# Patient Record
Sex: Male | Born: 1960 | Hispanic: No | Marital: Single | Smoking: Current some day smoker
Health system: Southern US, Community
[De-identification: ages and names within clinical notes are randomized; demographics above are authoritative.]

## PROBLEM LIST (undated history)

## (undated) DIAGNOSIS — M629 Disorder of muscle, unspecified: Secondary | ICD-10-CM

## (undated) DIAGNOSIS — N289 Disorder of kidney and ureter, unspecified: Secondary | ICD-10-CM

## (undated) HISTORY — DX: Disorder of kidney and ureter, unspecified: N28.9

## (undated) HISTORY — DX: Disorder of muscle, unspecified: M62.9

---

## 2000-06-22 ENCOUNTER — Emergency Department (HOSPITAL_COMMUNITY): Admission: EM | Admit: 2000-06-22 | Discharge: 2000-06-22 | Payer: Self-pay | Admitting: Emergency Medicine

## 2000-06-22 ENCOUNTER — Encounter: Payer: Self-pay | Admitting: Emergency Medicine

## 2012-09-08 ENCOUNTER — Ambulatory Visit (INDEPENDENT_AMBULATORY_CARE_PROVIDER_SITE_OTHER): Payer: Self-pay | Admitting: Emergency Medicine

## 2012-09-08 VITALS — BP 142/92 | HR 84 | Temp 98.0°F | Resp 16 | Ht 66.0 in | Wt 211.0 lb

## 2012-09-08 DIAGNOSIS — R109 Unspecified abdominal pain: Secondary | ICD-10-CM

## 2012-09-08 DIAGNOSIS — N201 Calculus of ureter: Secondary | ICD-10-CM

## 2012-09-08 LAB — POCT CBC
Granulocyte percent: 68.8 %G (ref 37–80)
HCT, POC: 46.6 % (ref 43.5–53.7)
Hemoglobin: 14.9 g/dL (ref 14.1–18.1)
Lymph, poc: 2.7 (ref 0.6–3.4)
MCH, POC: 26.6 pg — AB (ref 27–31.2)
MCHC: 32 g/dL (ref 31.8–35.4)
MCV: 83.2 fL (ref 80–97)
MID (cbc): 0.9 (ref 0–0.9)
MPV: 8.9 fL (ref 0–99.8)
POC Granulocyte: 7.9 — AB (ref 2–6.9)
POC LYMPH PERCENT: 23.8 %L (ref 10–50)
POC MID %: 7.4 %M (ref 0–12)
Platelet Count, POC: 329 10*3/uL (ref 142–424)
RBC: 5.6 M/uL (ref 4.69–6.13)
RDW, POC: 14.5 %
WBC: 11.5 10*3/uL — AB (ref 4.6–10.2)

## 2012-09-08 LAB — POCT URINALYSIS DIPSTICK
Bilirubin, UA: NEGATIVE
Glucose, UA: NEGATIVE
Ketones, UA: NEGATIVE
Nitrite, UA: NEGATIVE
Protein, UA: 100
Spec Grav, UA: >=1.03
Urobilinogen, UA: 0.2
pH, UA: 6

## 2012-09-08 LAB — POCT UA - MICROSCOPIC ONLY
Bacteria, U Microscopic: NEGATIVE
Casts, Ur, LPF, POC: NEGATIVE
Crystals, Ur, HPF, POC: NEGATIVE
Mucus, UA: POSITIVE
Yeast, UA: NEGATIVE

## 2012-09-08 MED ORDER — OMEPRAZOLE 40 MG PO CPDR: 40.0000 mg | DELAYED_RELEASE_CAPSULE | Freq: Every day | ORAL | Status: AC

## 2012-09-08 MED ORDER — HYDROCODONE-ACETAMINOPHEN 5-325 MG PO TABS
1.0000 | ORAL_TABLET | ORAL | Status: AC | PRN
Start: 2012-09-08 — End: 2012-09-18

## 2012-09-08 NOTE — Progress Notes (Signed)
[facility] [address], [place] [address] [phone]  Date:  09/08/2012   Name:  Donald Holmes   DOB:  February 13, 1961   MRN:  [id number]  PCP:  No primary provider on file.    Chief Complaint: Abdominal Pain and Hematuria   History of Present Illness:  Donald Holmes is a 51 y.o. very pleasant male patient who presents with the following:  three day history of upper abdominal discomfort/epigastric pain.  Says that it comes and goes and comes back suddenly associated with some palpitations.  Nauseated but not able to vomit.  No fever or chills, no food intolerance.  No heartburn or waterbrash.  No shortness of breath or wheezing.  Pain worse with resting.  Noting related to relief.  Pain rather intense currently.  Smokes.  No alcohol use.  No use excessive NSAID.  No DM, HBP, cholesterol issues.  FH stroke, HBP, CAD.    Patient has history of kidney stones and is currently having hematuria and pain into right groin.  Passed two stones last week  1600:   Patient says abdominal pain is gone  There is no problem list on file for this patient.   Past Medical History  Diagnosis Date  . Kidney disease   . Muscle disorder     No past surgical history on file.  History  Substance Use Topics  . Smoking status: Current Every Day Smoker  . Smokeless tobacco: Not on file  . Alcohol Use: Not on file    No family history on file.  No Known Allergies  Medication list has been reviewed and updated.  No current outpatient prescriptions on file prior to visit.    Review of Systems:  As per HPI, otherwise negative.    Physical Examination: Filed Vitals:   09/08/12 1431  BP: 142/92  Pulse: 84  Temp: 98 F (36.7 C)  Resp: 16   Filed Vitals:   09/08/12 1431  Height: 5\' 6"  (1.676 m)  Weight: 211 lb (95.709 kg)   Body mass index is 34.06 kg/(m^2). Ideal Body Weight: Weight in (lb) to have BMI = 25: 154.6   GEN: WDWN, moderate distress,  Non-toxic, A & O x 3  No sepsis or rash or shortness of breath.  Not icteric. HEENT: Atraumatic, Normocephalic. Neck supple. No masses, No LAD. Oropharynx negative  Ears and Nose: No external deformity.  TM negative CV: RRR, No M/G/R. No JVD. No thrill. No extra heart sounds. PULM: CTA B, no wheezes, crackles, rhonchi. No retractions. No resp. distress. No accessory muscle use. ABD: S, NT, ND, +BS. No rebound. No HSM. EXTR: No c/c/e NEURO Normal gait.  PSYCH: Normally interactive. Conversant. Not depressed or anxious appearing.  Calm demeanor.    Assessment and Plan: Abdominal pain Ureterolithiasis Hydrocodone Urology consult  Carmelina Dane, MD  Results for orders placed in visit on 09/08/12  POCT CBC      Component Value Range   WBC 11.5 (*) 4.6 - 10.2 K/uL   Lymph, poc 2.7  0.6 - 3.4   POC LYMPH PERCENT 23.8  10 - 50 %L   MID (cbc) 0.9  0 - 0.9   POC MID % 7.4  0 - 12 %M   POC Granulocyte 7.9 (*) 2 - 6.9   Granulocyte percent 68.8  37 - 80 %G   RBC 5.60  4.69 - 6.13 M/uL   Hemoglobin 14.9  14.1 - 18.1 g/dL   HCT, POC 46.6  43.5 - 53.7 %   MCV 83.2  80 - 97 fL   MCH, POC 26.6 (*) 27 - 31.2 pg   MCHC 32.0  31.8 - 35.4 g/dL   RDW, POC 96.0     Platelet Count, POC 329  142 - 424 K/uL   MPV 8.9  0 - 99.8 fL  POCT UA - MICROSCOPIC ONLY      Component Value Range   WBC, Ur, HPF, POC 2-11     RBC, urine, microscopic 14-20     Bacteria, U Microscopic neg     Mucus, UA positive     Epithelial cells, urine per micros 2-5     Crystals, Ur, HPF, POC neg     Casts, Ur, LPF, POC neg     Yeast, UA neg    POCT URINALYSIS DIPSTICK      Component Value Range   Color, UA yellow     Clarity, UA cloudy     Glucose, UA neg     Bilirubin, UA neg     Ketones, UA neg     Spec Grav, UA >=1.030     Blood, UA large     pH, UA 6.0     Protein, UA 100     Urobilinogen, UA 0.2     Nitrite, UA neg     Leukocytes, UA small (1+)

## 2012-09-09 LAB — COMPREHENSIVE METABOLIC PANEL
ALT: 15 U/L (ref 0–53)
AST: 12 U/L (ref 0–37)
Albumin: 4.3 g/dL (ref 3.5–5.2)
Alkaline Phosphatase: 97 U/L (ref 39–117)
BUN: 8 mg/dL (ref 6–23)
CO2: 25 mEq/L (ref 19–32)
Calcium: 7.8 mg/dL — ABNORMAL LOW (ref 8.4–10.5)
Chloride: 105 mEq/L (ref 96–112)
Creat: 0.78 mg/dL (ref 0.50–1.35)
Glucose, Bld: 104 mg/dL — ABNORMAL HIGH (ref 70–99)
Potassium: 4.9 mEq/L (ref 3.5–5.3)
Sodium: 141 mEq/L (ref 135–145)
Total Bilirubin: 0.6 mg/dL (ref 0.3–1.2)
Total Protein: 6.9 g/dL (ref 6.0–8.3)

## 2012-09-09 LAB — H. PYLORI ANTIBODY, IGG: H Pylori IgG: 5.11 {ISR} — ABNORMAL HIGH

## 2012-09-11 MED ORDER — TINIDAZOLE 500 MG PO TABS: 500.0000 mg | ORAL_TABLET | Freq: Two times a day (BID) | ORAL | Status: DC

## 2012-09-11 MED ORDER — CLARITHROMYCIN ER 500 MG PO TB24: 500.0000 mg | ORAL_TABLET | Freq: Two times a day (BID) | ORAL | Status: DC

## 2012-09-11 MED ORDER — AMOXICILLIN 500 MG PO CAPS: 1000.0000 mg | ORAL_CAPSULE | Freq: Two times a day (BID) | ORAL | Status: DC

## 2012-09-11 MED ORDER — RABEPRAZOLE SODIUM 20 MG PO TBEC: 20.0000 mg | DELAYED_RELEASE_TABLET | Freq: Every day | ORAL | Status: DC

## 2012-09-11 NOTE — Addendum Note (Signed)
Addended by: Carmelina Dane on: 09/11/2012 01:37 PM   Modules accepted: Orders

## 2012-09-11 NOTE — Progress Notes (Signed)
  Subjective:    Patient ID: Donald Holmes, male    DOB: October 14, 1961, 51 y.o.   MRN: [id number]  HPI    Review of Systems     Objective:   Physical Exam        Assessment & Plan:    Patient has H.pylori positive.  Will treat using sequential therapy described in [place]

## 2014-05-20 ENCOUNTER — Emergency Department (HOSPITAL_COMMUNITY): Payer: Self-pay

## 2014-05-20 ENCOUNTER — Emergency Department (HOSPITAL_COMMUNITY)
Admission: EM | Admit: 2014-05-20 | Discharge: 2014-05-20 | Disposition: A | Discharge status: Home / Self Care | Payer: Self-pay | Attending: Emergency Medicine | Admitting: Emergency Medicine

## 2014-05-20 ENCOUNTER — Encounter (HOSPITAL_COMMUNITY): Payer: Self-pay | Admitting: Emergency Medicine

## 2014-05-20 DIAGNOSIS — S199XXA Unspecified injury of neck, initial encounter: Secondary | ICD-10-CM

## 2014-05-20 DIAGNOSIS — S46909A Unspecified injury of unspecified muscle, fascia and tendon at shoulder and upper arm level, unspecified arm, initial encounter: Secondary | ICD-10-CM | POA: Insufficient documentation

## 2014-05-20 DIAGNOSIS — IMO0002 Reserved for concepts with insufficient information to code with codable children: Secondary | ICD-10-CM | POA: Insufficient documentation

## 2014-05-20 DIAGNOSIS — W19XXXA Unspecified fall, initial encounter: Secondary | ICD-10-CM

## 2014-05-20 DIAGNOSIS — S79929A Unspecified injury of unspecified thigh, initial encounter: Secondary | ICD-10-CM

## 2014-05-20 DIAGNOSIS — S4980XA Other specified injuries of shoulder and upper arm, unspecified arm, initial encounter: Secondary | ICD-10-CM | POA: Insufficient documentation

## 2014-05-20 DIAGNOSIS — Z79899 Other long term (current) drug therapy: Secondary | ICD-10-CM | POA: Insufficient documentation

## 2014-05-20 DIAGNOSIS — Y929 Unspecified place or not applicable: Secondary | ICD-10-CM | POA: Insufficient documentation

## 2014-05-20 DIAGNOSIS — Z87448 Personal history of other diseases of urinary system: Secondary | ICD-10-CM | POA: Insufficient documentation

## 2014-05-20 DIAGNOSIS — W010XXA Fall on same level from slipping, tripping and stumbling without subsequent striking against object, initial encounter: Secondary | ICD-10-CM | POA: Insufficient documentation

## 2014-05-20 DIAGNOSIS — F17200 Nicotine dependence, unspecified, uncomplicated: Secondary | ICD-10-CM | POA: Insufficient documentation

## 2014-05-20 DIAGNOSIS — Y9389 Activity, other specified: Secondary | ICD-10-CM | POA: Insufficient documentation

## 2014-05-20 DIAGNOSIS — S0993XA Unspecified injury of face, initial encounter: Secondary | ICD-10-CM | POA: Insufficient documentation

## 2014-05-20 DIAGNOSIS — S79919A Unspecified injury of unspecified hip, initial encounter: Secondary | ICD-10-CM | POA: Insufficient documentation

## 2014-05-20 DIAGNOSIS — S6990XA Unspecified injury of unspecified wrist, hand and finger(s), initial encounter: Secondary | ICD-10-CM | POA: Insufficient documentation

## 2014-05-20 MED ORDER — IBUPROFEN 600 MG PO TABS: 600.0000 mg | ORAL_TABLET | Freq: Four times a day (QID) | ORAL | Status: DC | PRN

## 2014-05-20 MED ORDER — KETOROLAC TROMETHAMINE 60 MG/2ML IM SOLN
60.0000 mg | Freq: Once | INTRAMUSCULAR | Status: AC
  Administered 2014-05-20: 60 mg via INTRAMUSCULAR
  Filled 2014-05-20: qty 2

## 2014-05-20 MED ORDER — HYDROCODONE-ACETAMINOPHEN 5-325 MG PO TABS: 1.0000 | ORAL_TABLET | ORAL | Status: DC | PRN

## 2014-05-20 NOTE — Discharge Instructions (Signed)
Your x-rays were all normal. Take ibuprofen as prescribed for pain. Norco for severe pain as prescribed as needed. Ice your hand and shoulder several times a day. Followup with primary care Dr. for recheck.

## 2014-05-20 NOTE — ED Notes (Signed)
Pt states he slipped and fell today on tile floor @ 1430-1500 today. Pt c/o pain to R th finger, R groin, R sided stiff neck, R back stiffness.

## 2014-05-20 NOTE — ED Provider Notes (Signed)
Medical screening examination/treatment/procedure(s) were performed by non-physician practitioner and as supervising physician I was immediately available for consultation/collaboration.   EKG Interpretation None       Derwood KaplanAnkit Lorelai Huyser, MD 05/20/14 2305

## 2014-05-20 NOTE — ED Provider Notes (Signed)
CSN: [id number]     Arrival date & time 05/20/14  0035 History   First MD Initiated Contact with Patient 05/20/14 [phone]     Chief Complaint  Patient presents with  . Fall     (Consider location/radiation/quality/duration/timing/severity/associated sxs/prior Treatment) HPI Donald Holmes is a 53 y.o. male who presents to emergency department complaining of a fall. Patient states he was at a grocery store and slipped on some water, states he fell down. He did not hit his head or have loss of consciousness. He states he fell onto the right side. He reports pain in the right shoulder, right hand, right hip. He states his neck is tight enough. He took a Microbiologistercocet and ibuprofen before coming in with no relief of his symptoms. States the pain is sharp, worsened with movement, nothing makes it better. No numbness or weakness in extremities. No loss of bowels or urinary incontinence or retention. No other complaints.  Past Medical History  Diagnosis Date  . Kidney disease   . Muscle disorder    History reviewed. No pertinent past surgical history. No family history on file. History  Substance Use Topics  . Smoking status: Current Some Day Smoker  . Smokeless tobacco: Not on file  . Alcohol Use: Yes     Comment: rare    Review of Systems  Constitutional: Negative for fever and chills.  Respiratory: Negative for cough, chest tightness and shortness of breath.   Cardiovascular: Negative for chest pain, palpitations and leg swelling.  Gastrointestinal: Negative for nausea, vomiting, abdominal pain, diarrhea and abdominal distention.  Genitourinary: Negative for dysuria, urgency, frequency and hematuria.  Musculoskeletal: Positive for arthralgias, back pain, joint swelling, myalgias, neck pain and neck stiffness.  Skin: Negative for rash.  Allergic/Immunologic: Negative for immunocompromised state.  Neurological: Negative for dizziness, weakness, light-headedness, numbness and headaches.       Allergies  Review of patient's allergies indicates no known allergies.  Home Medications   Prior to Admission medications   Medication Sig Start Date End Date Taking? Authorizing Provider  ibuprofen (ADVIL,MOTRIN) 200 MG tablet Take 200 mg by mouth every 6 (six) hours as needed for moderate pain.   Yes Historical Provider, MD  omeprazole (PRILOSEC) 40 MG capsule Take 1 capsule (40 mg total) by mouth daily. 09/08/12  Yes Phillips OdorJeffery Anderson, MD  oxyCODONE-acetaminophen (PERCOCET/ROXICET) 5-325 MG per tablet Take 1 tablet by mouth as needed.   Yes Historical Provider, MD   BP 170/99  Pulse 103  Temp(Src) 98.7 F (37.1 C) (Oral)  Resp 18  Ht 5\' 6"  (1.676 m)  Wt 215 lb (97.523 kg)  BMI 34.72 kg/m2  SpO2 98% Physical Exam  Nursing note and vitals reviewed. Constitutional: He is oriented to person, place, and time. He appears well-developed and well-nourished. No distress.  HENT:  Head: Normocephalic and atraumatic.  Eyes: Conjunctivae are normal.  Neck: Neck supple.  Cardiovascular: Normal rate, regular rhythm and normal heart sounds.   Pulmonary/Chest: Effort normal. No respiratory distress. He has no wheezes. He has no rales.  Abdominal: Soft. Bowel sounds are normal. He exhibits no distension. There is no tenderness. There is no rebound.  Musculoskeletal:  No midline cervical or thoracic spine tenderness. Bilateral perivertebral cervical spine tenderness. Pain over anterior right shoulder. Pain with ROM of the right shoulder. Limitted rom with flexion due to pain. Tender to palpation over right 5th and 4th metacarpals. Pain with ROM of the 4th and 5th fingers at MCP joints. Right hip tender to palpation over  greater trochanter. Pain with ROM. Full ROM. Right knee and ankle normal. Dorsal pedal pulses intact.   Neurological: He is alert and oriented to person, place, and time.  Gait is normal  Skin: Skin is warm and dry.  Psychiatric: He has a normal mood and affect. His  behavior is normal.    ED Course  Procedures (including critical care time) Labs Review Labs Reviewed - No data to display  Imaging Review Dg Lumbar Spine Complete  05/20/2014   CLINICAL DATA:  Fall.  Back stiffness  EXAM: LUMBAR SPINE - COMPLETE 4+ VIEW  COMPARISON:  None.  FINDINGS: No evidence of acute fracture or traumatic malalignment.  There are bulky osteophytes from the lower thoracic and lumbar endplates which could be degenerative or from spondyloarthropathy. There is focal degenerative disc narrowing at L5-S1. Large bilateral lamellated renal calculi, measuring up to 3 cm.  IMPRESSION: 1. No acute osseous findings. 2. Large bilateral renal calculi. Recommend outpatient urology referral.   Electronically Signed   By: Tiburcio PeaJonathan  Watts M.D.   On: 05/20/2014 02:13   Dg Shoulder Right  05/20/2014   CLINICAL DATA:  Fall.  Right shoulder pain.  EXAM: RIGHT SHOULDER - 2+ VIEW  COMPARISON:  None.  FINDINGS: Degenerative changes in the right AC and glenohumeral joints. No acute bony abnormality. Specifically, no fracture, subluxation, or dislocation. Soft tissues are intact.  IMPRESSION: No acute bony abnormality.   Electronically Signed   By: Charlett NoseKevin  Dover M.D.   On: 05/20/2014 02:11   Dg Hip Complete Right  05/20/2014   CLINICAL DATA:  Fall.  Right hip pain.  EXAM: RIGHT HIP - COMPLETE 2+ VIEW  COMPARISON:  None.  FINDINGS: Slight joint space narrowing and spurring in the hip joints bilaterally. SI joints are symmetric and unremarkable. No acute bony abnormality. Specifically, no fracture, subluxation, or dislocation. Soft tissues are intact.  IMPRESSION: No acute bony abnormality.   Electronically Signed   By: Charlett NoseKevin  Dover M.D.   On: 05/20/2014 02:11   Dg Hand Complete Right  05/20/2014   CLINICAL DATA:  Fall.  Pain.  EXAM: RIGHT HAND - COMPLETE 3+ VIEW  COMPARISON:  None.  FINDINGS: Moderate degenerative changes in the IP joints and first carpometacarpal joint. Mild joint space narrowing and  spurring in the MCP joints. No fracture, subluxation or dislocation.  IMPRESSION: No acute bony abnormality.   Electronically Signed   By: Charlett NoseKevin  Dover M.D.   On: 05/20/2014 02:12     EKG Interpretation None      MDM   Final diagnoses:  Fall, initial encounter    Patient is here after a fall at a grocery store. He is ambulatory. Pain to the right hand, right shoulder, right hip, lumbar spine. X-rays obtained, negative. Patient drove to emergency department, treated with Toradol in the ED. Home with Norco, ibuprofen, followup with primary care Dr. Montez MoritaX-rays the lumbar spine that showed kidney stones, patient is aware. Will follow with urology.   Filed Vitals:   05/20/14 0055 05/20/14 0316  BP: 170/99 158/90  Pulse: 103 98  Temp: 98.7 F (37.1 C)   TempSrc: Oral   Resp: 18   Height: 5\' 6"  (1.676 m)   Weight: 215 lb (97.523 kg)   SpO2: 98% 98%       Quashawn Jewkes A Quinnetta Roepke, PA-C 05/20/14 0522

## 2015-02-10 ENCOUNTER — Ambulatory Visit (INDEPENDENT_AMBULATORY_CARE_PROVIDER_SITE_OTHER): Payer: Self-pay

## 2015-02-10 ENCOUNTER — Ambulatory Visit (INDEPENDENT_AMBULATORY_CARE_PROVIDER_SITE_OTHER): Payer: Self-pay | Admitting: Family Medicine

## 2015-02-10 VITALS — BP 150/100 | HR 91 | Temp 98.1°F | Resp 16 | Ht 66.0 in | Wt 210.0 lb

## 2015-02-10 DIAGNOSIS — N139 Obstructive and reflux uropathy, unspecified: Secondary | ICD-10-CM

## 2015-02-10 DIAGNOSIS — N138 Other obstructive and reflux uropathy: Secondary | ICD-10-CM

## 2015-02-10 DIAGNOSIS — N200 Calculus of kidney: Secondary | ICD-10-CM

## 2015-02-10 DIAGNOSIS — N4889 Other specified disorders of penis: Secondary | ICD-10-CM

## 2015-02-10 DIAGNOSIS — R1032 Left lower quadrant pain: Secondary | ICD-10-CM

## 2015-02-10 LAB — POCT UA - MICROSCOPIC ONLY
Bacteria, U Microscopic: NEGATIVE
CASTS, UR, LPF, POC: NEGATIVE
Crystals, Ur, HPF, POC: NEGATIVE
Mucus, UA: POSITIVE
Yeast, UA: NEGATIVE

## 2015-02-10 LAB — POCT URINALYSIS DIPSTICK
BILIRUBIN UA: NEGATIVE
GLUCOSE UA: NEGATIVE
KETONES UA: NEGATIVE
NITRITE UA: NEGATIVE
PROTEIN UA: 100
Spec Grav, UA: 1.02
Urobilinogen, UA: 0.2
pH, UA: 5

## 2015-02-10 LAB — COMPREHENSIVE METABOLIC PANEL
ALBUMIN: 4.2 g/dL (ref 3.5–5.2)
ALT: 14 U/L (ref 0–53)
AST: 9 U/L (ref 0–37)
Alkaline Phosphatase: 88 U/L (ref 39–117)
BUN: 13 mg/dL (ref 6–23)
CHLORIDE: 102 meq/L (ref 96–112)
CO2: 23 meq/L (ref 19–32)
CREATININE: 0.98 mg/dL (ref 0.50–1.35)
Calcium: 9.7 mg/dL (ref 8.4–10.5)
Glucose, Bld: 102 mg/dL — ABNORMAL HIGH (ref 70–99)
POTASSIUM: 4.4 meq/L (ref 3.5–5.3)
Sodium: 139 mEq/L (ref 135–145)
Total Bilirubin: 0.5 mg/dL (ref 0.2–1.2)
Total Protein: 7.4 g/dL (ref 6.0–8.3)

## 2015-02-10 LAB — POCT CBC
GRANULOCYTE PERCENT: 70.9 % (ref 37–80)
HCT, POC: 41.3 % — AB (ref 43.5–53.7)
Hemoglobin: 13.1 g/dL — AB (ref 14.1–18.1)
Lymph, poc: 2.1 (ref 0.6–3.4)
MCH, POC: 23.1 pg — AB (ref 27–31.2)
MCHC: 31.7 g/dL — AB (ref 31.8–35.4)
MCV: 72.8 fL — AB (ref 80–97)
MID (cbc): 0.7 (ref 0–0.9)
MPV: 7.7 fL (ref 0–99.8)
POC Granulocyte: 6.8 (ref 2–6.9)
POC LYMPH PERCENT: 22.1 %L (ref 10–50)
POC MID %: 7 %M (ref 0–12)
Platelet Count, POC: 301 10*3/uL (ref 142–424)
RBC: 5.68 M/uL (ref 4.69–6.13)
RDW, POC: 15.7 %
WBC: 96 10*3/uL — AB (ref 4.6–10.2)

## 2015-02-10 LAB — URIC ACID: URIC ACID, SERUM: 5.9 mg/dL (ref 4.0–7.8)

## 2015-02-10 MED ORDER — TAMSULOSIN HCL 0.4 MG PO CAPS: 0.4000 mg | ORAL_CAPSULE | Freq: Every day | ORAL | Status: AC

## 2015-02-10 MED ORDER — HYDROCODONE-ACETAMINOPHEN 5-325 MG PO TABS: 1.0000 | ORAL_TABLET | ORAL | Status: AC | PRN

## 2015-02-10 NOTE — Progress Notes (Addendum)
Subjective: 54 year old man who presents with a history of having kidney stones off and on over the years. He had not had any for a year. Over the last week or so he has been passing multiple stones. He says his past at least 10 of them. He brought in a cup with 3 stones. He has had difficulty urinating since yesterday. He feels like he has one blocking his urethra with pain in the penis about an inch or HI half from the end of the penis. No fever or chills. He only saw blood wants.  Objective: No CVA tenderness. Abdomen soft with tenderness in the left lower quadrant. Palpation to the right lower quadrant give some referred pain over into the left lower quadrant. 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 His penis appears normal and feels normal but he is tender about an inch or inch and a half from the end of the penis along the shaft.  Assessment: Kidney stones Penile pain Low abdominal pain   Plan: KUB Urinalysis  Results for orders placed or performed in visit on 02/10/15  POCT CBC  Result Value Ref Range   WBC 96 (A) 4.6 - 10.2 K/uL   Lymph, poc 2.1 0.6 - 3.4   POC LYMPH PERCENT 22.1 10 - 50 %L   MID (cbc) 0.7 0 - 0.9   POC MID % 7.0 0 - 12 %M   POC Granulocyte 6.8 2 - 6.9   Granulocyte percent 70.9 37 - 80 %G   RBC 5.68 4.69 - 6.13 M/uL   Hemoglobin 13.1 (A) 14.1 - 18.1 g/dL   HCT, POC 16.141.3 (A) 09.643.5 - 53.7 %   MCV 72.8 (A) 80 - 97 fL   MCH, POC 23.1 (A) 27 - 31.2 pg   MCHC 31.7 (A) 31.8 - 35.4 g/dL   RDW, POC 04.515.7 %   Platelet Count, POC 301 142 - 424 K/uL   MPV 7.7 0 - 99.8 fL  POCT UA - Microscopic Only  Result Value Ref Range   WBC, Ur, HPF, POC 1-4    RBC, urine, microscopic 3-20    Bacteria, U Microscopic neg    Mucus, UA positive    Epithelial cells, urine per micros 0-1    Crystals, Ur, HPF, POC neg    Casts, Ur, LPF, POC neg    Yeast, UA neg   POCT urinalysis dipstick  Result Value Ref Range   Color, UA yellow    Clarity, UA clear    Glucose, UA neg    Bilirubin, UA neg    Ketones, UA neg    Spec Grav, UA 1.020    Blood, UA moderate    pH, UA 5.0    Protein, UA 100    Urobilinogen, UA 0.2    Nitrite, UA neg    Leukocytes, UA small (1+)    [facility] reading (PRIMARY) by  Dr. Alwyn RenHopper Bilateral kidney stones. Also has a stone below the bladder that is probably in his urethra.  Assessment: Kidney stones and urethral stone with pain  Plan: Did not order straining of urine security brought in several stones it was sent for analysis. Drink fluids. Take pain medicine. Take Flomax. Return if worse at anytime. Refer to urology early next week.

## 2015-02-10 NOTE — Patient Instructions (Signed)
Drink lots and lots of water  Take the hydrodone pain pills every 4-6 hours if needed for pain  Take the Flomax(tamsulosin) one pill daily. Some people it may make lightheaded when you first start the medicine, so be careful when you get up and down. Make sure you clear your head and are not dizzy before walking.  You will be referred to a urologist next week.  If your pain gets worse or is not improving [facility]

## 2015-02-11 LAB — URINE CULTURE
Colony Count: NO GROWTH
Organism ID, Bacteria: NO GROWTH

## 2015-02-13 ENCOUNTER — Encounter: Payer: Self-pay | Admitting: Family Medicine

## 2015-02-16 LAB — STONE ANALYSIS: Stone Weight KSTONE: 0.087 g

## 2015-02-17 ENCOUNTER — Encounter: Payer: Self-pay | Admitting: Family Medicine

## 2015-08-25 IMAGING — CR DG ABDOMEN 1V
3 series · 3 of 3 positions shown · non-contrast
Comparison: None.

CLINICAL DATA: Renal calculi

EXAM:
ABDOMEN - 1 VIEW

[AP (1 of 3)]
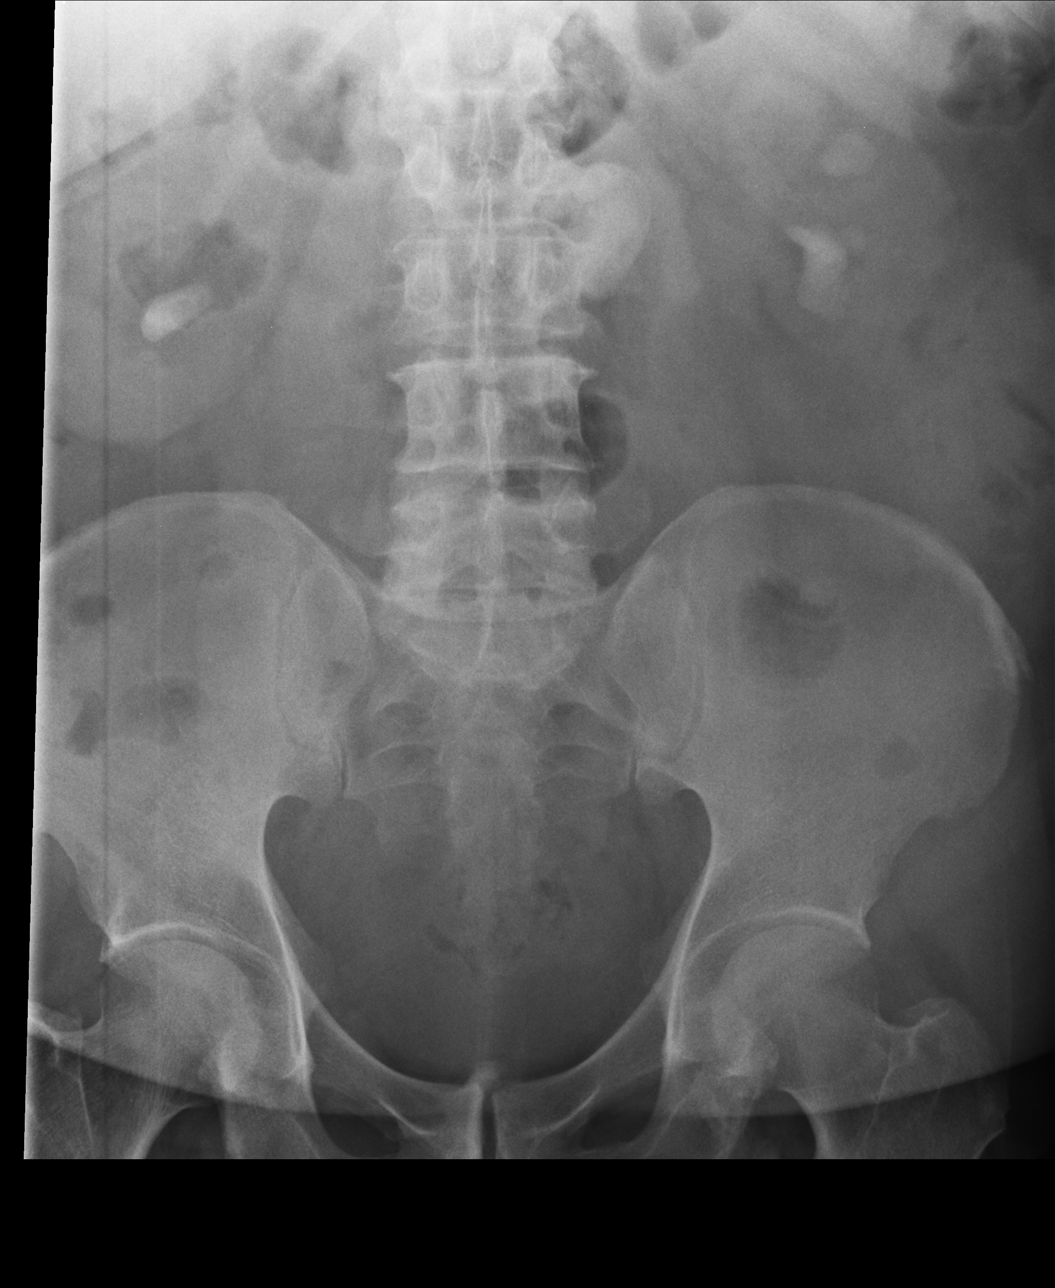

[AP (2 of 3)]
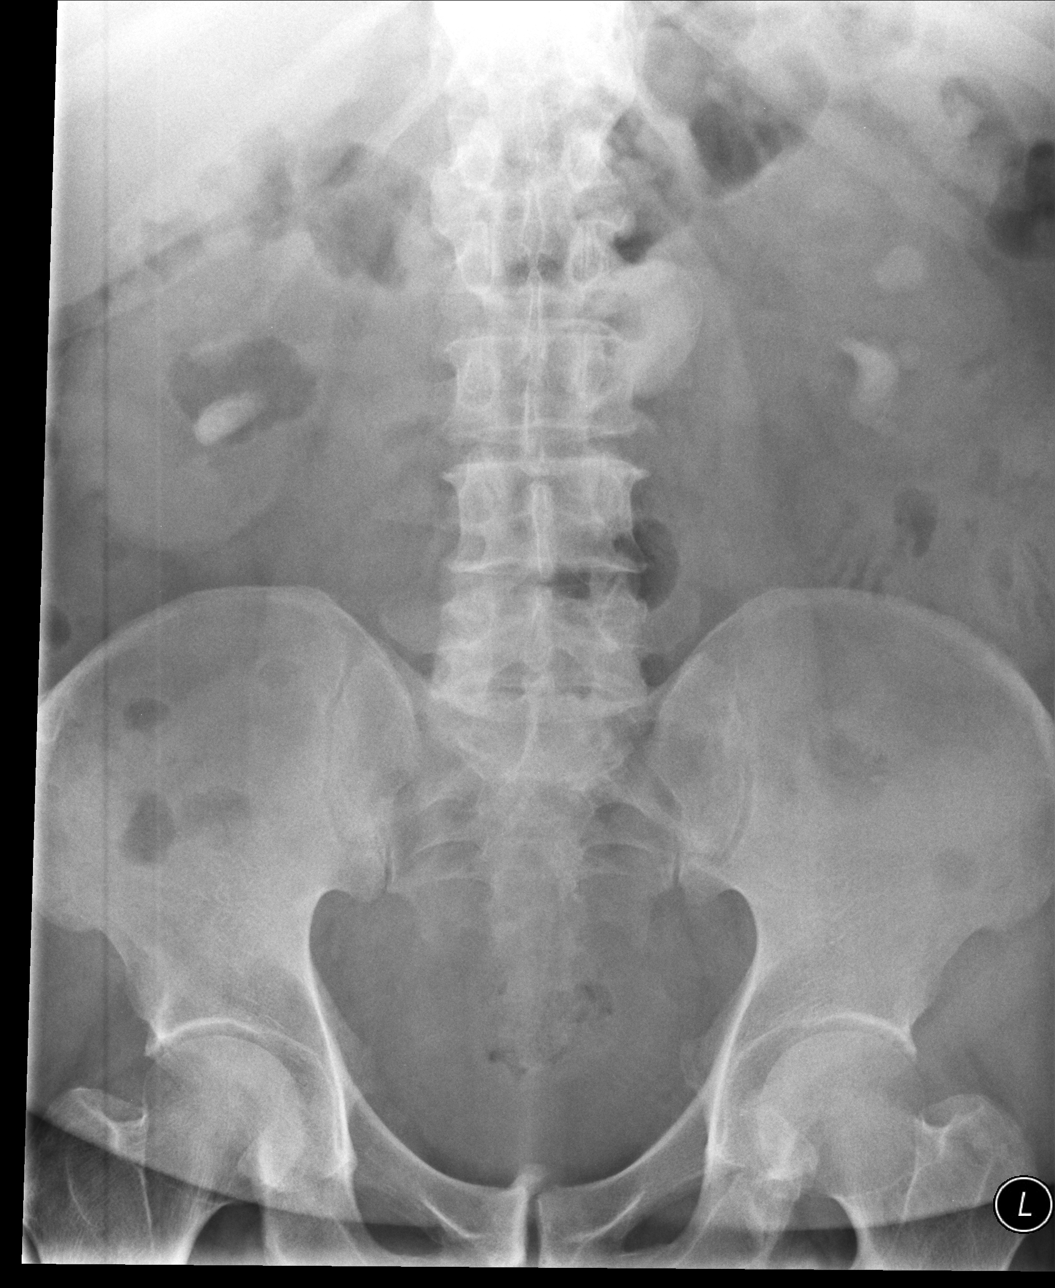

[AP (3 of 3)]
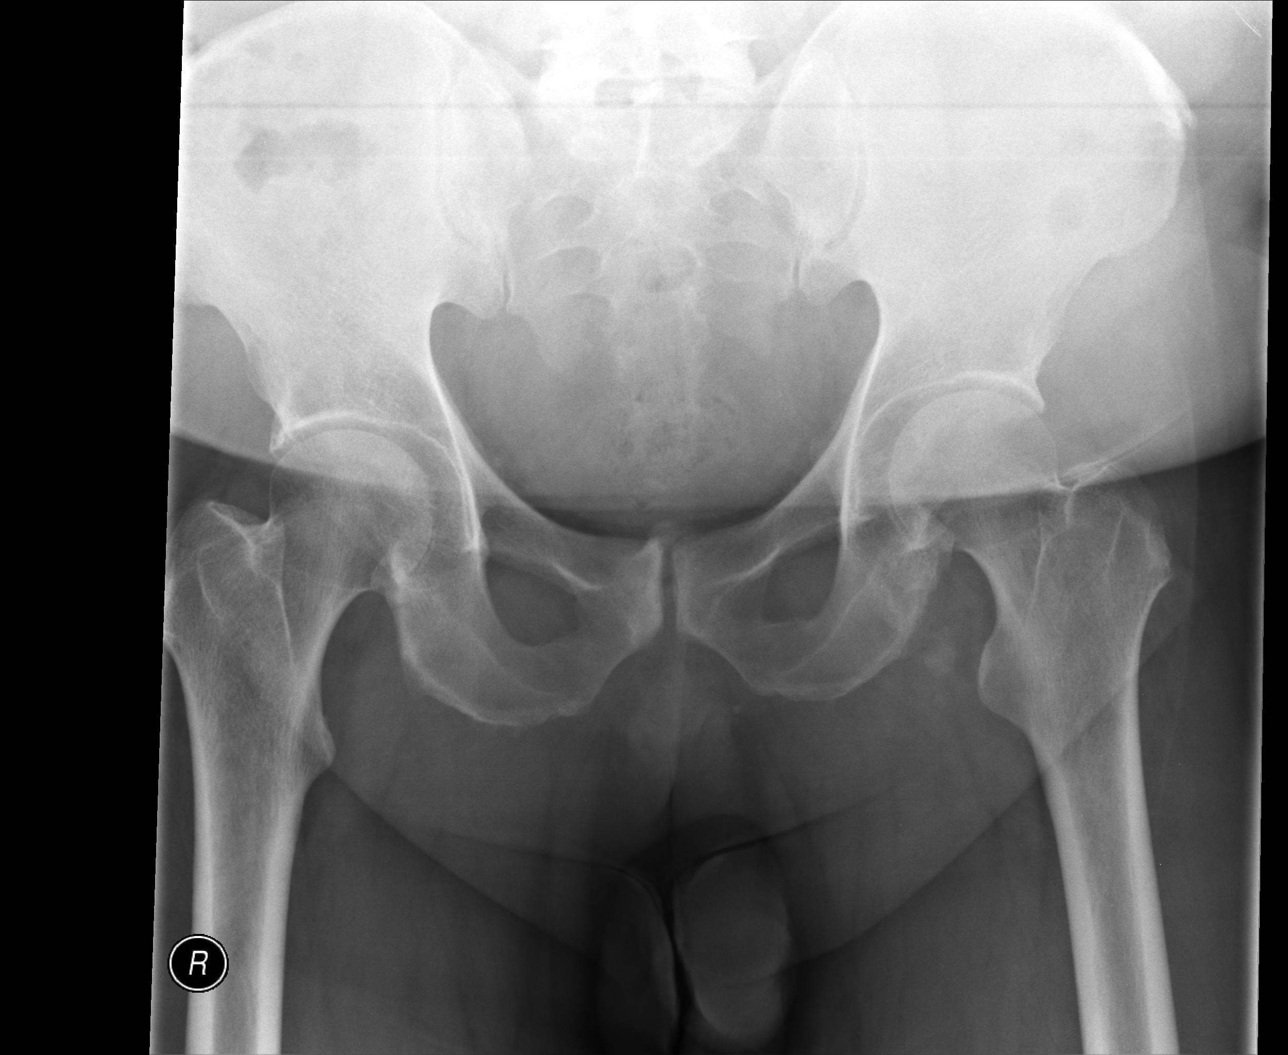

[3 of 3 positions shown; findings below may reference images not displayed]

FINDINGS: There is a calculus in the lower pole of the right kidney measuring
2.7 x 1.2 cm. There is a calculus in the lower pole of the left
kidney measuring 2.9 x 1.5 cm. There is an adjacent lower pole
calculus on the left measuring 1.3 x 0.5 cm. A third left kidney
lower pole calculus measures 1.1 x 0.9 cm. A midportion renal
calculus on the left measures 1.7 x 1.5 cm. There is a calcification
either in the inferior bladder or prostate measuring 1.1 x 0.7 cm.
There is moderate stool in the colon. The bowel gas pattern is
unremarkable. No obstruction or free air. There is a large left
lateral osteophyte at L2-3 on the left.
IMPRESSION: Sizable renal calculi bilaterally. Calcification either in the
inferior urinary bladder or prostate in the lower mid pelvis. Bowel
gas pattern unremarkable.

## 2018-09-03 ENCOUNTER — Emergency Department (HOSPITAL_COMMUNITY)
Admission: EM | Admit: 2018-09-03 | Discharge: 2018-09-17 | Disposition: E | Payer: Self-pay | Attending: Emergency Medicine | Admitting: Emergency Medicine

## 2018-09-03 DIAGNOSIS — Z79899 Other long term (current) drug therapy: Secondary | ICD-10-CM | POA: Insufficient documentation

## 2018-09-03 DIAGNOSIS — F17200 Nicotine dependence, unspecified, uncomplicated: Secondary | ICD-10-CM | POA: Insufficient documentation

## 2018-09-03 DIAGNOSIS — I469 Cardiac arrest, cause unspecified: Secondary | ICD-10-CM | POA: Insufficient documentation

## 2018-09-03 MED ORDER — EPINEPHRINE PF 1 MG/10ML IJ SOSY
PREFILLED_SYRINGE | INTRAMUSCULAR | Status: DC | PRN
  Administered 2018-09-03 (×3): 1 mg via INTRAVENOUS

## 2018-09-03 MED ORDER — SODIUM BICARBONATE 8.4 % IV SOLN
INTRAVENOUS | Status: DC | PRN
  Administered 2018-09-03: 50 meq via INTRAVENOUS

## 2018-09-03 MED ORDER — CALCIUM CHLORIDE 10 % IV SOLN
INTRAVENOUS | Status: DC | PRN
  Administered 2018-09-03: 1 g via INTRAVENOUS

## 2018-09-17 NOTE — Code Documentation (Signed)
Cardiac US performed by MD Curatolo with no evident cardiac activity.

## 2018-09-17 NOTE — Code Documentation (Signed)
Pt arrived by [facility] as witnessed arrest with CPR in progress, patient was at work when suddenly developed shortness of breath. On fire arrival, patient was gray but able to speak - stated no known medical problems & not on any medications, was coughing up frothy red sputum per EMS. Initial BP 280/160. EMS gave 1 nitro and initiated CPAP - approximately 3 minutes later patient became apneic and pulseless in PEA. Received 5 epis prior to arrival with no shockable rhythm. On arrival to ER patient had been down for approximately 30 minutes with CPR.

## 2018-09-17 NOTE — ED Notes (Signed)
Paged the chaplain per RN

## 2018-09-17 NOTE — ED Provider Notes (Signed)
[facility] EMERGENCY DEPARTMENT Provider Note   CSN: [id number] Arrival date & time: 09-22-18  1109     History   Chief Complaint Chief Complaint  Patient presents with  . Cardiac Arrest    HPI Jorryn Casagrande is a 57 y.o. male.  LEVEL 5 CAVEAT DUE TO MEDICAL CODE  The history is provided by the EMS personnel.  Cardiac Arrest  Witnessed by:  Healthcare provider Incident location:  Work Time since incident:  30 minutes Time before ALS initiated:  Immediate Condition upon EMS arrival:  Alert/oriented Treatments prior to arrival:  ACLS protocol Airway: king airway. Rhythm on admission to ED:  PEA   Past Medical History:  Diagnosis Date  . Kidney disease   . Muscle disorder     There are no active problems to display for this patient.   No past surgical history on file.      Home Medications    Prior to Admission medications   Medication Sig Start Date End Date Taking? Authorizing Provider  HYDROcodone-acetaminophen (NORCO/VICODIN) 5-325 MG per tablet Take 1-2 tablets by mouth every 4 (four) hours as needed for moderate pain or severe pain. 02/10/15   Peyton Najjar, MD  ibuprofen (ADVIL,MOTRIN) 200 MG tablet Take 200 mg by mouth every 6 (six) hours as needed for moderate pain.    [provider]  omeprazole (PRILOSEC) 40 MG capsule Take 1 capsule (40 mg total) by mouth daily. Patient not taking: Reported on 02/10/2015 09/08/12   Carmelina Dane, MD  oxyCODONE-acetaminophen (PERCOCET/ROXICET) 5-325 MG per tablet Take 1 tablet by mouth as needed.    [provider]  ranitidine (ZANTAC) 150 MG tablet Take 150 mg by mouth 2 (two) times daily.    [provider]  tamsulosin (FLOMAX) 0.4 MG CAPS capsule Take 1 capsule (0.4 mg total) by mouth daily. 02/10/15   Peyton Najjar, MD    Family History Family History  Problem Relation Age of Onset  . Heart disease Father   . Hypertension Father     Social  History Social History   Tobacco Use  . Smoking status: Current Some Day Smoker  Substance Use Topics  . Alcohol use: Yes    Comment: rare  . Drug use: Yes    Types: Marijuana     Allergies   Patient has no known allergies.   Review of Systems Review of Systems  Unable to perform ROS: Acuity of condition     Physical Exam Updated Vital Signs  ED Triage Vitals  Enc Vitals Group     BP      Pulse      Resp      Temp      Temp src      SpO2      Weight      Height      Head Circumference      Peak Flow      Pain Score      Pain Loc      Pain Edu?      Excl. in GC?     Physical Exam  Constitutional: He appears listless. He is intubated (king airway in place).  HENT:  Head: Normocephalic and atraumatic.  Nose: Nose normal.  Pinky froth from mouth  Eyes: Conjunctivae are normal. Right pupil is not reactive. Left pupil is not reactive.  Neck: Trachea normal. Neck supple.  Cardiovascular: Exam reveals no decreased pulses.  Pulses:  Carotid pulses are 0 on the right side, and 0 on the left side.      Femoral pulses are 0 on the right side, and 0 on the left side. Pulmonary/Chest: He is intubated (king airway in place). He has decreased breath sounds.  Abdominal: Soft. Normal appearance.  Musculoskeletal: He exhibits edema (b/l pitting edema). He exhibits no deformity.  Neurological: He appears listless. GCS eye subscore is 1. GCS verbal subscore is 1. GCS motor subscore is 1.  Skin: Skin is dry.     ED Treatments / Results  Labs (all labs ordered are listed, but only abnormal results are displayed) Labs Reviewed - No data to display  EKG None  Radiology No results found.  Procedures .Critical Care Performed by: Virgina Norfolk, DO Authorized by: Virgina Norfolk, DO   Critical care provider statement:    Critical care time (minutes):  35   Critical care time was exclusive of:  Separately billable procedures and treating other patients and  teaching time   Critical care was necessary to treat or prevent imminent or life-threatening deterioration of the following conditions:  Cardiac failure   Critical care was time spent personally by me on the following activities:  Discussions with consultants, evaluation of patient's response to treatment, examination of patient, interpretation of cardiac output measurements, ordering and performing treatments and interventions, pulse oximetry, re-evaluation of patient's condition and review of old charts   I assumed direction of critical care for this patient from another provider in my specialty: no     (including critical care time)  Medications Ordered in ED Medications  EPINEPHrine (ADRENALIN) 1 MG/10ML injection (1 mg Intravenous Given 2018-10-01 1119)  calcium chloride injection (1 g Intravenous Given 10/01/2018 1117)  sodium bicarbonate injection (50 mEq Intravenous Given 10/01/18 1118)     Initial Impression / Assessment and Plan / ED Course  I have reviewed the triage vital signs and the nursing notes.  Pertinent labs & imaging results that were available during my care of the patient were reviewed by me and considered in my medical decision making (see chart for details).     Avrohom Mckelvin is a 57 year old male with no significant medical history presents the ED with CPR in progress.  Patient was found by EMS with shortness of breath and mentating well.  He lost pulses and appeared to go into respiratory arrest upon their care.  Patient had no pulse and CPR was initiated.  Patient had pink frothy sputum coming out from his mouth.  King airway was placed.  CPR was performed for 30 minutes with multiple rounds of epinephrine.  Patient was not on the monitor.  Patient arrives with CPR in progress.  Initial rhythm shows PEA.  No pulses.  Bedside echocardiogram did not show any cardiac activity.  B-lines diffusely on lung exam.  Patient morbidly obese.  Pitting edema bilaterally.  Multiple  rounds of CPR were performed.  Epinephrine was given.  Calcium chloride was given and sodium bicarbonate was given.  Patient did not have return of circulation.  He was pronounced dead at 11:20 AM.  Medical examiner was called and patient is not an ME case.  Death certificate was filled out.  Suspect likely patient with cardiac event leading to pulmonary edema.  Patient had family overseas in [place] and had a close friend that lives here in [place] and was made aware of patient's passing.  Final Clinical Impressions(s) / ED Diagnoses   Final diagnoses:  Cardiopulmonary arrest (HCC)  ED Discharge Orders    None       Virgina Norfolk, DO September 15, 2018 1213

## 2018-09-17 NOTE — Progress Notes (Signed)
I received a page from the ED concerning the patient's death and met with friends in the waiting room. I spoke with the medical staff and shared that there is no family in the [place]. I provided spiritual support by being present with the friends while the physician gave information concerning the patient's status. I led the friends to [facility] to be with the patient to offer prayers.    09-08-18 1150  Clinical Encounter Type  Visited With Patient not available;Health care provider  Visit Type Spiritual support;Death  Referral From Nurse  Consult/Referral To Chaplain  Spiritual Encounters  Spiritual Needs Grief support    Chaplain Dr Redgie Grayer

## 2018-09-17 NOTE — Code Documentation (Signed)
Patient time of death occurred at 11:20am. Pronounced by MD Curatolo.

## 2018-09-17 DEATH — deceased
